# Patient Record
Sex: Male | Born: 1940 | Hispanic: Yes | Marital: Married | Smoking: Never smoker
Health system: Southern US, Community
[De-identification: ages and names within clinical notes are randomized; demographics above are authoritative.]

## PROBLEM LIST (undated history)

## (undated) HISTORY — PX: PROSTATECTOMY: SHX69

---

## 1995-05-13 DIAGNOSIS — W3400XA Accidental discharge from unspecified firearms or gun, initial encounter: Secondary | ICD-10-CM

## 1995-05-13 HISTORY — DX: Accidental discharge from unspecified firearms or gun, initial encounter: W34.00XA

## 2020-12-14 ENCOUNTER — Inpatient Hospital Stay (HOSPITAL_COMMUNITY)
Admission: EM | Admit: 2020-12-14 | Discharge: 2020-12-18 | DRG: 025 | Disposition: A | Discharge status: Home / Self Care | Payer: Self-pay | Attending: Neurological Surgery | Admitting: Neurological Surgery

## 2020-12-14 ENCOUNTER — Emergency Department (HOSPITAL_COMMUNITY): Payer: Self-pay

## 2020-12-14 ENCOUNTER — Other Ambulatory Visit: Payer: Self-pay

## 2020-12-14 ENCOUNTER — Encounter (HOSPITAL_COMMUNITY): Payer: Self-pay | Admitting: *Deleted

## 2020-12-14 DIAGNOSIS — E860 Dehydration: Secondary | ICD-10-CM | POA: Diagnosis present

## 2020-12-14 DIAGNOSIS — G935 Compression of brain: Secondary | ICD-10-CM | POA: Diagnosis present

## 2020-12-14 DIAGNOSIS — Z20822 Contact with and (suspected) exposure to covid-19: Secondary | ICD-10-CM | POA: Diagnosis present

## 2020-12-14 DIAGNOSIS — S065XAA Traumatic subdural hemorrhage with loss of consciousness status unknown, initial encounter: Secondary | ICD-10-CM | POA: Diagnosis present

## 2020-12-14 DIAGNOSIS — R519 Headache, unspecified: Secondary | ICD-10-CM

## 2020-12-14 DIAGNOSIS — S065X9A Traumatic subdural hemorrhage with loss of consciousness of unspecified duration, initial encounter: Secondary | ICD-10-CM | POA: Diagnosis present

## 2020-12-14 DIAGNOSIS — R270 Ataxia, unspecified: Secondary | ICD-10-CM | POA: Diagnosis present

## 2020-12-14 DIAGNOSIS — I6200 Nontraumatic subdural hemorrhage, unspecified: Principal | ICD-10-CM | POA: Diagnosis present

## 2020-12-14 LAB — RESP PANEL BY RT-PCR (FLU A&B, COVID) ARPGX2
Influenza A by PCR: NEGATIVE
Influenza B by PCR: NEGATIVE
SARS Coronavirus 2 by RT PCR: NEGATIVE

## 2020-12-14 LAB — BASIC METABOLIC PANEL
Anion gap: 7 (ref 5–15)
BUN: 17 mg/dL (ref 8–23)
CO2: 22 mmol/L (ref 22–32)
Calcium: 8.5 mg/dL — ABNORMAL LOW (ref 8.9–10.3)
Chloride: 105 mmol/L (ref 98–111)
Creatinine, Ser: 1.13 mg/dL (ref 0.61–1.24)
GFR, Estimated: >60 mL/min (ref >60)
Glucose, Bld: 152 mg/dL — ABNORMAL HIGH (ref 70–99)
Potassium: 3.5 mmol/L (ref 3.5–5.1)
Sodium: 134 mmol/L — ABNORMAL LOW (ref 135–145)

## 2020-12-14 LAB — CBC WITH DIFFERENTIAL/PLATELET
Abs Immature Granulocytes: 0.03 10*3/uL (ref 0.00–0.07)
Basophils Absolute: 0 10*3/uL (ref 0.0–0.1)
Basophils Relative: 0 %
Eosinophils Absolute: 0 10*3/uL (ref 0.0–0.5)
Eosinophils Relative: 0 %
HCT: 39.5 % (ref 39.0–52.0)
Hemoglobin: 13.6 g/dL (ref 13.0–17.0)
Immature Granulocytes: 0 %
Lymphocytes Relative: 8 %
Lymphs Abs: 0.6 10*3/uL — ABNORMAL LOW (ref 0.7–4.0)
MCH: 31.3 pg (ref 26.0–34.0)
MCHC: 34.4 g/dL (ref 30.0–36.0)
MCV: 91 fL (ref 80.0–100.0)
Monocytes Absolute: 0.1 10*3/uL (ref 0.1–1.0)
Monocytes Relative: 2 %
Neutro Abs: 6.8 10*3/uL (ref 1.7–7.7)
Neutrophils Relative %: 90 %
Platelets: 293 10*3/uL (ref 150–400)
RBC: 4.34 MIL/uL (ref 4.22–5.81)
RDW: 14.1 % (ref 11.5–15.5)
WBC: 7.6 10*3/uL (ref 4.0–10.5)
nRBC: 0 % (ref 0.0–0.2)

## 2020-12-14 MED ORDER — ONDANSETRON HCL 4 MG/2ML IJ SOLN
4.0000 mg | Freq: Once | INTRAMUSCULAR | Status: AC
  Administered 2020-12-14: 4 mg via INTRAVENOUS
  Filled 2020-12-14: qty 2

## 2020-12-14 MED ORDER — PROCHLORPERAZINE EDISYLATE 10 MG/2ML IJ SOLN
10.0000 mg | Freq: Once | INTRAMUSCULAR | Status: AC
  Administered 2020-12-14: 10 mg via INTRAVENOUS
  Filled 2020-12-14: qty 2

## 2020-12-14 MED ORDER — DIPHENHYDRAMINE HCL 50 MG/ML IJ SOLN
12.5000 mg | Freq: Once | INTRAMUSCULAR | Status: AC
  Administered 2020-12-14: 12.5 mg via INTRAVENOUS
  Filled 2020-12-14: qty 1

## 2020-12-14 MED ORDER — DEXAMETHASONE SODIUM PHOSPHATE 10 MG/ML IJ SOLN
10.0000 mg | Freq: Once | INTRAMUSCULAR | Status: AC
  Administered 2020-12-14: 10 mg via INTRAVENOUS
  Filled 2020-12-14: qty 1

## 2020-12-14 MED ORDER — SODIUM CHLORIDE 0.9 % IV BOLUS
500.0000 mL | Freq: Once | INTRAVENOUS | Status: AC
  Administered 2020-12-14: 500 mL via INTRAVENOUS

## 2020-12-14 NOTE — ED Triage Notes (Signed)
C/o headache  x 2 weeks, also is vomiting onset today

## 2020-12-14 NOTE — H&P (Signed)
Neurosurgery H&P  CC: Headache, leg weakness  HPI: This is a 80 y.o. man that presents with progressively worsening headaches with nausea and vomiting as well as leg weakness and some difficulty ambulating. The patient is obtunded and unable to provide history, his son is at bedside and was able to provide further history for me. Symptoms have been present for 2 weeks and progressive, he denies any head trauma or falls, he reports the patient has been working up until a few days ago due to the severe headaches. He does not know of any anti-platelet or anti-coagulant medication use by the patient. Further history limited due to the patient's depressed mental status   ROS: A 14 point ROS was performed and is negative except as noted in the HPI but limited due to the patient's depressed mental status  PMHx: History reviewed. No pertinent past medical history. FamHx: No family history on file. SocHx:  reports that he has never smoked. He has never used smokeless tobacco. He reports previous alcohol use. He reports that he does not use drugs.  Exam: Vital signs in last 24 hours: Temp:  [97.7 F (36.5 C)-98.6 F (37 C)] 98.6 F (37 C) (08/05 2251) Pulse Rate:  [27-100] 96 (08/05 2251) Resp:  [13-22] 22 (08/05 2251) BP: (120-151)/(62-81) 142/67 (08/05 2251) SpO2:  [91 %-100 %] 96 % (08/05 2251) Weight:  [64 kg] 64 kg (08/05 1544) General: obtunded, sitting in bed, otherwise appears fairly healthy for stated age Head: Normocephalic and atruamatic HEENT: Neck supple Pulmonary: breathing RA with good sat Cardiac: RRR Abdomen: S NT ND Extremities: Warm and well perfused x4 Neuro: obtunded, occasionally opens eyes partially and repositions in bed but does not open eyes to stim, PERRL, conjugate gaze, w/d x4 without preference   Assessment and Plan: 80 y.o. man with headaches / N / V and ataxia. 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 Got hydromorphone this morning, was reportedly awake and interactive prior to that, now obtunded but protecting his airway well  -OR this morning for bilateral burr holes -4N ICU post-op  Jadene Pierini, MD 12/14/20 11:08 PM [facility]

## 2020-12-14 NOTE — ED Provider Notes (Addendum)
Patient was received at shift change from [facility] provided HPI, current work-up, likely disposition.  HPI was collected while using Spanish interpreter, patient with no significant medical history presents to the emergency department with chief complaints of occipital headaches, started proximately 2 weeks ago, came on suddenly, states he has progressing gotten worse, denies any change in vision, or paresthesias, he does endorse some slight leg weakness bilaterally, denies any recent falls, not on anticoagulant no history of migraines.  States has been taking Tylenol any significant relief.  Current work-up reveals CBC unremarkable, BMP shows slight hyponatremia of 134, hyperglycemia 152, calcium 8.5, respiratory panel negative,CT head reveals bilateral mixed density subdural hematomas with slight mass-effect, with slight downward herniation.  Per previous provider no focal deficits present.  Plan-consult with neurosurgery for further recommendations.   on my exam there is no facial asymmetry, no difficulty word finding, no unilateral weakness present, vital signs are unremarkable  Spoke with Dr. Johnsie Cancel he does not recommend any further management at this time, as long as patient remains alert and oriented no need for mannitol.  He recommends direct admit to [facility] under neurosurgery care, he will take the patient to the OR for drainage.  Patient will be transferred to [facility] for further evaluation.  Transport has been delayed, concern for decompensation with inadequate support will, will send patient ED to ED. Dr. Rosalia Hammers at [facility] emergency department has accepted the patient in transfer and will be admitted by Dr. Johnsie Cancel of neurosurgery.    Patient is reevaluated no facial asymmetry, no slurring of his words, no difficult word finding, no unilateral weakness present, currently mentating, vital signs have remained stable.     Carroll Sage, PA-C 12/14/20  2029    Carroll Sage, PA-C 12/14/20 2124    Pricilla Loveless, MD 12/14/20 [phone]

## 2020-12-14 NOTE — ED Provider Notes (Signed)
[facility] EMERGENCY DEPARTMENT Provider Note   CSN: [id number] Arrival date & time: 12/14/20  1533     History Chief Complaint  Patient presents with   Headache    Ricky Valencia is a 80 y.o. male with no significant past medical history presenting for evaluation of a constant and worsening occipital headache which started approximately 2 weeks ago.  He has had no traumas to relate regarding this headache.  He denies neck pain or stiffness and has had no fevers or chills.  He denies prior history of headache problems.  His headache has progressively worsened, it initially was responding to Tylenol but Tylenol does not currently make any difference.  He does have some mild photophobia and has had development of nausea with a couple of episodes of vomiting today.  Person at the bedside he was seen at [facility] department today and blood tests were drawn and he was found to be dehydrated and was sent here for treatment of that condition.  He denies reason for dehydration has not been outdoors in the heat and has been drinking plenty of fluids, particularly Gatorade.  He has had no recent illnesses, denies sinus or ear pain, no sore throat, nasal drainage.  He also denies chest pain, shortness of breath, abdominal pain.  He has had no focal weakness.  He does however endorse some mild lightheadedness.  His headache is worsened when he lies down.  The history is provided by the patient and a relative. The history is limited by a language barrier. A language interpreter was used.      History reviewed. No pertinent past medical history.  There are no problems to display for this patient.   History reviewed. No pertinent surgical history.     No family history on file.  Social History   Tobacco Use   Smoking status: Never   Smokeless tobacco: Never  Substance Use Topics   Alcohol use: Not Currently   Drug use: Never    Home Medications Prior to Admission  medications   Not on File    Allergies    Patient has no known allergies.  Review of Systems   Review of Systems  Constitutional:  Negative for chills and fever.  HENT:  Negative for congestion, sinus pain and sore throat.   Eyes:  Positive for photophobia. Negative for pain and visual disturbance.  Respiratory:  Negative for chest tightness and shortness of breath.   Cardiovascular:  Negative for chest pain.  Gastrointestinal:  Positive for nausea and vomiting. Negative for abdominal pain.  Genitourinary: Negative.   Musculoskeletal:  Negative for arthralgias, joint swelling and neck pain.  Skin: Negative.  Negative for rash and wound.  Neurological:  Positive for light-headedness and headaches. Negative for dizziness, weakness and numbness.  Psychiatric/Behavioral: Negative.    All other systems reviewed and are negative.  Physical Exam Updated Vital Signs BP 133/81   Pulse 75   Temp 97.7 F (36.5 C) (Oral)   Resp 16   Ht 5\' 1"  (1.549 m)   Wt 64 kg   SpO2 99%   BMI 26.64 kg/m   Physical Exam Vitals and nursing note reviewed.  Constitutional:      Appearance: He is well-developed.     Comments: Sitting with eyes closed.  Uncomfortable appearing  HENT:     Head: Normocephalic and atraumatic.     Comments: No scalp hematoma, no rash.     Right Ear: Tympanic membrane normal.  Left Ear: Tympanic membrane normal.  Eyes:     Extraocular Movements: Extraocular movements intact.     Right eye: Normal extraocular motion.     Left eye: Normal extraocular motion.     Pupils: Pupils are equal, round, and reactive to light.  Cardiovascular:     Rate and Rhythm: Normal rate.     Heart sounds: Normal heart sounds.  Pulmonary:     Effort: Pulmonary effort is normal.  Abdominal:     Palpations: Abdomen is soft.     Tenderness: There is no abdominal tenderness.  Musculoskeletal:        General: Normal range of motion.     Cervical back: Normal range of motion and neck  supple. No rigidity.  Lymphadenopathy:     Cervical: No cervical adenopathy.  Skin:    General: Skin is warm and dry.     Findings: No rash.  Neurological:     General: No focal deficit present.     Mental Status: He is alert and oriented to person, place, and time.     GCS: GCS eye subscore is 4. GCS verbal subscore is 5. GCS motor subscore is 6.     Cranial Nerves: No cranial nerve deficit or facial asymmetry.     Sensory: No sensory deficit.     Coordination: Coordination normal.     Gait: Gait normal.     Deep Tendon Reflexes: Reflexes normal.     Comments: Normal heel-shin, rapid alternating movements are slow and deliberate but coordinated. Cranial nerves III-XII intact.  No pronator drift.  Psychiatric:        Speech: Speech normal.        Behavior: Behavior normal.        Thought Content: Thought content normal.    ED Results / Procedures / Treatments   Labs (all labs ordered are listed, but only abnormal results are displayed) Labs Reviewed  CBC WITH DIFFERENTIAL/PLATELET  BASIC METABOLIC PANEL    EKG None  Radiology No results found.  Procedures Procedures   Medications Ordered in ED Medications  ondansetron (ZOFRAN) injection 4 mg (4 mg Intravenous Given 12/14/20 1743)  sodium chloride 0.9 % bolus 500 mL (500 mLs Intravenous New Bag/Given 12/14/20 1847)  dexamethasone (DECADRON) injection 10 mg (10 mg Intravenous Given 12/14/20 1847)  prochlorperazine (COMPAZINE) injection 10 mg (10 mg Intravenous Given 12/14/20 1849)  diphenhydrAMINE (BENADRYL) injection 12.5 mg (12.5 mg Intravenous Given 12/14/20 1848)    ED Course  I have reviewed the triage vital signs and the nursing notes.  Pertinent labs & imaging results that were available during my care of the patient were reviewed by me and considered in my medical decision making (see chart for details).    MDM Rules/Calculators/A&P                           Patient with a 2-week history of escalating occipital  headache, mild photophobia, now with nausea and vomiting.  He has no nuchal rigidity, no fevers, doubt meningitis.  CT imaging and basic labs have been ordered.  He was also given a 500 cc bolus of normal saline given his diagnosis of dehydration this morning at the health department.  Migraine cocktail was also ordered.  Pending management pending results of today's test.  Discussed with Will Uvaldo Rising, PA-C who assumes care of this patient. Final Clinical Impression(s) / ED Diagnoses Final diagnoses:  None    Rx / DC  Orders ED Discharge Orders     None        Lendon Lain 12/14/20 Aretha Parrot, MD 12/14/20 [phone]

## 2020-12-14 NOTE — ED Provider Notes (Signed)
10:50 PM Patient care assumed in transfer.  He is pending admission to the neurosurgical service.  Consult placed to Dr. Maurice Small to alert him to the patient's arrival in the [facility], ED.  Patient is easily awake and alert.  He follows commands.  He has no obvious focal deficits, moving all extremities spontaneously.  VSS.  11:05 PM Dr. Maurice Small aware of patient's arrival. He will place admission orders.   Antony Madura, PA-C 12/14/20 2306    Margarita Grizzle, MD 12/18/20 0000

## 2020-12-15 ENCOUNTER — Encounter (HOSPITAL_COMMUNITY): Payer: Self-pay | Admitting: Neurological Surgery

## 2020-12-15 ENCOUNTER — Encounter (HOSPITAL_COMMUNITY)
Admission: EM | Disposition: A | Discharge status: Home / Self Care | Payer: Self-pay | Source: Home / Self Care | Attending: Neurological Surgery

## 2020-12-15 ENCOUNTER — Inpatient Hospital Stay (HOSPITAL_COMMUNITY): Payer: Self-pay | Admitting: Certified Registered"

## 2020-12-15 HISTORY — PX: CRANIOTOMY: SHX93

## 2020-12-15 LAB — PROTIME-INR
INR: 1 (ref 0.8–1.2)
Prothrombin Time: 13.3 seconds (ref 11.4–15.2)

## 2020-12-15 LAB — CBC
HCT: 40.2 % (ref 39.0–52.0)
Hemoglobin: 14 g/dL (ref 13.0–17.0)
MCH: 31.7 pg (ref 26.0–34.0)
MCHC: 34.8 g/dL (ref 30.0–36.0)
MCV: 91 fL (ref 80.0–100.0)
Platelets: 258 10*3/uL (ref 150–400)
RBC: 4.42 MIL/uL (ref 4.22–5.81)
RDW: 13.9 % (ref 11.5–15.5)
WBC: 7.7 10*3/uL (ref 4.0–10.5)
nRBC: 0 % (ref 0.0–0.2)

## 2020-12-15 LAB — CREATININE, SERUM
Creatinine, Ser: 1.09 mg/dL (ref 0.61–1.24)
GFR, Estimated: >60 mL/min (ref >60)

## 2020-12-15 LAB — MRSA NEXT GEN BY PCR, NASAL: MRSA by PCR Next Gen: NOT DETECTED

## 2020-12-15 LAB — APTT: aPTT: 26 seconds (ref 24–36)

## 2020-12-15 SURGERY — CRANIOTOMY HEMATOMA EVACUATION SUBDURAL
Anesthesia: General | Site: Head | Laterality: Bilateral

## 2020-12-15 MED ORDER — ORAL CARE MOUTH RINSE: 15.0000 mL | Freq: Once | OROMUCOSAL | Status: DC

## 2020-12-15 MED ORDER — SUCCINYLCHOLINE CHLORIDE 200 MG/10ML IV SOSY
PREFILLED_SYRINGE | INTRAVENOUS | Status: DC | PRN
  Administered 2020-12-15: 80 mg via INTRAVENOUS

## 2020-12-15 MED ORDER — HYDROCODONE-ACETAMINOPHEN 5-325 MG PO TABS
1.0000 | ORAL_TABLET | ORAL | Status: DC | PRN
Start: 2020-12-15 — End: 2020-12-18

## 2020-12-15 MED ORDER — ONDANSETRON HCL 4 MG PO TABS: 4.0000 mg | ORAL_TABLET | ORAL | Status: DC | PRN

## 2020-12-15 MED ORDER — PROPOFOL 10 MG/ML IV BOLUS
INTRAVENOUS | Status: DC | PRN
  Administered 2020-12-15: 100 mg via INTRAVENOUS

## 2020-12-15 MED ORDER — THROMBIN 20000 UNITS EX SOLR
CUTANEOUS | Status: DC | PRN
  Administered 2020-12-15: 20 mL via TOPICAL

## 2020-12-15 MED ORDER — ONDANSETRON HCL 4 MG/2ML IJ SOLN
INTRAMUSCULAR | Status: AC
  Filled 2020-12-15: qty 2

## 2020-12-15 MED ORDER — HYDROMORPHONE HCL 1 MG/ML IJ SOLN
0.5000 mg | INTRAMUSCULAR | Status: DC | PRN
Start: 2020-12-15 — End: 2020-12-18
  Administered 2020-12-15 (×2): 0.5 mg via INTRAVENOUS
  Filled 2020-12-15 (×2): qty 1

## 2020-12-15 MED ORDER — CHLORHEXIDINE GLUCONATE 0.12 % MT SOLN: 15.0000 mL | Freq: Once | OROMUCOSAL | Status: DC

## 2020-12-15 MED ORDER — SODIUM CHLORIDE 0.9 % IV SOLN: INTRAVENOUS | Status: DC

## 2020-12-15 MED ORDER — BACITRACIN ZINC 500 UNIT/GM EX OINT
TOPICAL_OINTMENT | CUTANEOUS | Status: AC
  Filled 2020-12-15: qty 28.35

## 2020-12-15 MED ORDER — THROMBIN 5000 UNITS EX SOLR
OROMUCOSAL | Status: DC | PRN
  Administered 2020-12-15: 5 mL via TOPICAL

## 2020-12-15 MED ORDER — ACETAMINOPHEN 650 MG RE SUPP: 650.0000 mg | RECTAL | Status: DC | PRN

## 2020-12-15 MED ORDER — ACETAMINOPHEN 325 MG PO TABS
650.0000 mg | ORAL_TABLET | ORAL | Status: DC | PRN
  Administered 2020-12-18: 650 mg via ORAL
  Filled 2020-12-15 (×2): qty 2

## 2020-12-15 MED ORDER — THROMBIN 5000 UNITS EX SOLR
CUTANEOUS | Status: AC
  Filled 2020-12-15: qty 5000

## 2020-12-15 MED ORDER — PHENYLEPHRINE HCL-NACL 20-0.9 MG/250ML-% IV SOLN
INTRAVENOUS | Status: DC | PRN
Start: 2020-12-15 — End: 2020-12-15
  Administered 2020-12-15: 40 ug/min via INTRAVENOUS

## 2020-12-15 MED ORDER — PROMETHAZINE HCL 25 MG PO TABS: 12.5000 mg | ORAL_TABLET | ORAL | Status: DC | PRN

## 2020-12-15 MED ORDER — PHENYLEPHRINE 40 MCG/ML (10ML) SYRINGE FOR IV PUSH (FOR BLOOD PRESSURE SUPPORT)
PREFILLED_SYRINGE | INTRAVENOUS | Status: DC | PRN
  Administered 2020-12-15 (×3): 80 ug via INTRAVENOUS
  Administered 2020-12-15: 160 ug via INTRAVENOUS

## 2020-12-15 MED ORDER — DEXAMETHASONE SODIUM PHOSPHATE 10 MG/ML IJ SOLN
INTRAMUSCULAR | Status: DC | PRN
  Administered 2020-12-15: 8 mg via INTRAVENOUS

## 2020-12-15 MED ORDER — DOCUSATE SODIUM 100 MG PO CAPS
100.0000 mg | ORAL_CAPSULE | Freq: Two times a day (BID) | ORAL | Status: DC
  Administered 2020-12-15 – 2020-12-18 (×4): 100 mg via ORAL
  Filled 2020-12-15 (×2): qty 1

## 2020-12-15 MED ORDER — HEPARIN SODIUM (PORCINE) 5000 UNIT/ML IJ SOLN
5000.0000 [IU] | Freq: Three times a day (TID) | INTRAMUSCULAR | Status: DC
  Administered 2020-12-17 – 2020-12-18 (×4): 5000 [IU] via SUBCUTANEOUS
  Filled 2020-12-15 (×4): qty 1

## 2020-12-15 MED ORDER — LIDOCAINE HCL (CARDIAC) PF 100 MG/5ML IV SOSY
PREFILLED_SYRINGE | INTRAVENOUS | Status: DC | PRN
  Administered 2020-12-15: 40 mg via INTRATRACHEAL

## 2020-12-15 MED ORDER — ONDANSETRON HCL 4 MG/2ML IJ SOLN
4.0000 mg | INTRAMUSCULAR | Status: DC | PRN
  Administered 2020-12-15: 4 mg via INTRAVENOUS
  Filled 2020-12-15: qty 2

## 2020-12-15 MED ORDER — BACITRACIN ZINC 500 UNIT/GM EX OINT
TOPICAL_OINTMENT | CUTANEOUS | Status: DC | PRN
  Administered 2020-12-15: 1 via TOPICAL

## 2020-12-15 MED ORDER — ROCURONIUM BROMIDE 10 MG/ML (PF) SYRINGE
PREFILLED_SYRINGE | INTRAVENOUS | Status: AC
  Filled 2020-12-15: qty 10

## 2020-12-15 MED ORDER — 0.9 % SODIUM CHLORIDE (POUR BTL) OPTIME
TOPICAL | Status: DC | PRN
  Administered 2020-12-15 (×2): 1000 mL

## 2020-12-15 MED ORDER — DEXAMETHASONE SODIUM PHOSPHATE 10 MG/ML IJ SOLN
INTRAMUSCULAR | Status: AC
  Filled 2020-12-15: qty 1

## 2020-12-15 MED ORDER — FENTANYL CITRATE (PF) 250 MCG/5ML IJ SOLN
INTRAMUSCULAR | Status: DC | PRN
  Administered 2020-12-15: 50 ug via INTRAVENOUS
  Administered 2020-12-15: 25 ug via INTRAVENOUS

## 2020-12-15 MED ORDER — FENTANYL CITRATE (PF) 250 MCG/5ML IJ SOLN
INTRAMUSCULAR | Status: AC
  Filled 2020-12-15: qty 5

## 2020-12-15 MED ORDER — LIDOCAINE-EPINEPHRINE 1 %-1:100000 IJ SOLN
INTRAMUSCULAR | Status: DC | PRN
  Administered 2020-12-15: 10 mL via INTRADERMAL

## 2020-12-15 MED ORDER — LABETALOL HCL 5 MG/ML IV SOLN: 10.0000 mg | INTRAVENOUS | Status: DC | PRN

## 2020-12-15 MED ORDER — ROCURONIUM BROMIDE 10 MG/ML (PF) SYRINGE
PREFILLED_SYRINGE | INTRAVENOUS | Status: DC | PRN
  Administered 2020-12-15: 30 mg via INTRAVENOUS

## 2020-12-15 MED ORDER — CEFAZOLIN SODIUM-DEXTROSE 2-3 GM-%(50ML) IV SOLR
INTRAVENOUS | Status: DC | PRN
  Administered 2020-12-15: 2 g via INTRAVENOUS

## 2020-12-15 MED ORDER — SUGAMMADEX SODIUM 200 MG/2ML IV SOLN
INTRAVENOUS | Status: DC | PRN
  Administered 2020-12-15: 200 mg via INTRAVENOUS

## 2020-12-15 MED ORDER — THROMBIN 20000 UNITS EX SOLR
CUTANEOUS | Status: AC
  Filled 2020-12-15: qty 20000

## 2020-12-15 MED ORDER — LIDOCAINE-EPINEPHRINE 1 %-1:100000 IJ SOLN
INTRAMUSCULAR | Status: AC
  Filled 2020-12-15: qty 1

## 2020-12-15 MED ORDER — CHLORHEXIDINE GLUCONATE CLOTH 2 % EX PADS
6.0000 | MEDICATED_PAD | Freq: Every day | CUTANEOUS | Status: DC
  Administered 2020-12-15 – 2020-12-18 (×4): 6 via TOPICAL

## 2020-12-15 MED ORDER — POLYETHYLENE GLYCOL 3350 17 G PO PACK: 17.0000 g | PACK | Freq: Every day | ORAL | Status: DC | PRN

## 2020-12-15 MED ORDER — ONDANSETRON HCL 4 MG/2ML IJ SOLN
INTRAMUSCULAR | Status: DC | PRN
  Administered 2020-12-15: 4 mg via INTRAVENOUS

## 2020-12-15 SURGICAL SUPPLY — 60 items
BAG COUNTER SPONGE SURGICOUNT (BAG) ×2 IMPLANT
BLADE CLIPPER SURG (BLADE) ×2 IMPLANT
BNDG GAUZE ELAST 4 BULKY (GAUZE/BANDAGES/DRESSINGS) IMPLANT
BUR ACORN 9.0 PRECISION (BURR) ×2 IMPLANT
BUR MATCHSTICK NEURO 3.0 LAGG (BURR) IMPLANT
BUR SPIRAL ROUTER 2.3 (BUR) ×2 IMPLANT
CANISTER SUCT 3000ML PPV (MISCELLANEOUS) ×2 IMPLANT
CLIP VESOCCLUDE MED 6/CT (CLIP) IMPLANT
DRAPE NEUROLOGICAL W/INCISE (DRAPES) ×2 IMPLANT
DRAPE SHEET LG 3/4 BI-LAMINATE (DRAPES) ×2 IMPLANT
DRAPE SURG 17X23 STRL (DRAPES) IMPLANT
DRAPE WARM FLUID 44X44 (DRAPES) ×2 IMPLANT
DURAPREP 6ML APPLICATOR 50/CS (WOUND CARE) ×2 IMPLANT
ELECT REM PT RETURN 9FT ADLT (ELECTROSURGICAL) ×2
ELECTRODE REM PT RTRN 9FT ADLT (ELECTROSURGICAL) ×1 IMPLANT
EVACUATOR 1/8 PVC DRAIN (DRAIN) IMPLANT
EVACUATOR SILICONE 100CC (DRAIN) IMPLANT
GAUZE 4X4 16PLY ~~LOC~~+RFID DBL (SPONGE) ×4 IMPLANT
GAUZE SPONGE 4X4 12PLY STRL (GAUZE/BANDAGES/DRESSINGS) ×2 IMPLANT
GLOVE EXAM NITRILE LRG STRL (GLOVE) IMPLANT
GLOVE EXAM NITRILE XL STR (GLOVE) IMPLANT
GLOVE EXAM NITRILE XS STR PU (GLOVE) IMPLANT
GLOVE SURG LTX SZ7 (GLOVE) ×2 IMPLANT
GLOVE SURG LTX SZ7.5 (GLOVE) ×4 IMPLANT
GLOVE SURG UNDER POLY LF SZ7.5 (GLOVE) ×4 IMPLANT
GOWN STRL REUS W/ TWL LRG LVL3 (GOWN DISPOSABLE) ×2 IMPLANT
GOWN STRL REUS W/ TWL XL LVL3 (GOWN DISPOSABLE) IMPLANT
GOWN STRL REUS W/TWL 2XL LVL3 (GOWN DISPOSABLE) IMPLANT
GOWN STRL REUS W/TWL LRG LVL3 (GOWN DISPOSABLE) ×2
GOWN STRL REUS W/TWL XL LVL3 (GOWN DISPOSABLE)
HEMOSTAT POWDER KIT SURGIFOAM (HEMOSTASIS) ×2 IMPLANT
HEMOSTAT SURGICEL 2X14 (HEMOSTASIS) IMPLANT
KIT BASIN OR (CUSTOM PROCEDURE TRAY) ×2 IMPLANT
KIT TURNOVER KIT B (KITS) ×2 IMPLANT
NEEDLE HYPO 22GX1.5 SAFETY (NEEDLE) ×2 IMPLANT
NS IRRIG 1000ML POUR BTL (IV SOLUTION) ×2 IMPLANT
PACK CRANIOTOMY CUSTOM (CUSTOM PROCEDURE TRAY) ×2 IMPLANT
PATTIES SURGICAL .5 X.5 (GAUZE/BANDAGES/DRESSINGS) IMPLANT
PATTIES SURGICAL .5 X3 (DISPOSABLE) IMPLANT
PATTIES SURGICAL 1X1 (DISPOSABLE) IMPLANT
SPONGE NEURO XRAY DETECT 1X3 (DISPOSABLE) IMPLANT
SPONGE SURGIFOAM ABS GEL 100 (HEMOSTASIS) ×2 IMPLANT
STAPLER VISISTAT 35W (STAPLE) ×2 IMPLANT
STOCKINETTE 6  STRL (DRAPES) ×1
STOCKINETTE 6 STRL (DRAPES) ×1 IMPLANT
SUT ETHILON 3 0 FSL (SUTURE) IMPLANT
SUT ETHILON 3 0 PS 1 (SUTURE) IMPLANT
SUT MNCRL AB 3-0 PS2 27 (SUTURE) ×2 IMPLANT
SUT MON AB 3-0 SH 27 (SUTURE)
SUT MON AB 3-0 SH27 (SUTURE) IMPLANT
SUT NURALON 4 0 TR CR/8 (SUTURE) ×6 IMPLANT
SUT VIC AB 0 CT1 18XCR BRD8 (SUTURE) ×2 IMPLANT
SUT VIC AB 0 CT1 8-18 (SUTURE) ×2
SUT VIC AB 3-0 SH 8-18 (SUTURE) ×4 IMPLANT
TOWEL GREEN STERILE (TOWEL DISPOSABLE) ×2 IMPLANT
TOWEL GREEN STERILE FF (TOWEL DISPOSABLE) ×2 IMPLANT
TRAY FOLEY MTR SLVR 16FR STAT (SET/KITS/TRAYS/PACK) ×2 IMPLANT
TUBE CONNECTING 12X1/4 (SUCTIONS) ×2 IMPLANT
UNDERPAD 30X36 HEAVY ABSORB (UNDERPADS AND DIAPERS) ×2 IMPLANT
WATER STERILE IRR 1000ML POUR (IV SOLUTION) ×2 IMPLANT

## 2020-12-15 NOTE — ED Notes (Signed)
Med given for headache 

## 2020-12-15 NOTE — Transfer of Care (Signed)
Immediate Anesthesia Transfer of Care Note  Patient: Ricky Valencia  Procedure(s) Performed: BILATERAL BURR HOLES FOR SUBDURAL HEMATOMA EVACUATION (Bilateral: Head)  Patient Location: PACU  Anesthesia Type:General  Level of Consciousness: sedated  Airway & Oxygen Therapy: Patient Spontanous Breathing and Patient connected to nasal cannula oxygen  Post-op Assessment: Report given to RN and Post -op Vital signs reviewed and stable  Post vital signs: Reviewed and stable  Last Vitals:  Vitals Value Taken Time  BP 110/67 12/15/20 1300  Temp    Pulse 85 12/15/20 1300  Resp 17 12/15/20 1300  SpO2 100 % 12/15/20 1300    Last Pain:  Vitals:   12/15/20 1123  TempSrc: Oral  PainSc:          Complications: No notable events documented.

## 2020-12-15 NOTE — ED Notes (Signed)
The  Pt is resting now  Apparently the pain med kicked in

## 2020-12-15 NOTE — ED Notes (Signed)
Family at  The bedside reports  The pt has had a headache for 2 weeks  Worse today  The pt has pulled off his gown pulls at wires keeping his eyes closed moves all 4 etremities he speaks no english

## 2020-12-15 NOTE — Progress Notes (Signed)
Neurosurgery Service Progress Note  Subjective: No acute events overnight   Objective: Vitals:   12/15/20 1000 12/15/20 1030 12/15/20 1050 12/15/20 1123  BP: 125/75 (!) 142/65 135/78 (!) 148/75  Pulse: 71 79 75 94  Resp: 14 15 15 18   Temp:   98.1 F (36.7 C) 98.3 F (36.8 C)  TempSrc:   Oral Oral  SpO2: 99% 99% 99% 98%  Weight:      Height:        Physical Exam: Somnolent, raises eyebrows to voice but doesn't open eyes, w/d x4  Assessment & Plan: 80 y.o. man w/ b/l mixed SDH and obtundation.  -OR today for b/l burr hole evacuation  72  12/15/20 11:53 AM

## 2020-12-15 NOTE — Anesthesia Procedure Notes (Signed)
Procedure Name: Intubation Date/Time: 12/15/2020 11:55 AM Performed by: Leonor Liv, CRNA Pre-anesthesia Checklist: Patient identified, Emergency Drugs available, Suction available and Patient being monitored Patient Re-evaluated:Patient Re-evaluated prior to induction Oxygen Delivery Method: Circle System Utilized Preoxygenation: Pre-oxygenation with 100% oxygen Induction Type: IV induction Ventilation: Mask ventilation without difficulty Laryngoscope Size: Mac and 4 Grade View: Grade I Tube type: Oral Tube size: 7.5 mm Number of attempts: 1 Airway Equipment and Method: Stylet and Oral airway Placement Confirmation: ETT inserted through vocal cords under direct vision, positive ETCO2 and breath sounds checked- equal and bilateral Secured at: 22 cm Tube secured with: Tape Dental Injury: Teeth and Oropharynx as per pre-operative assessment

## 2020-12-15 NOTE — Progress Notes (Signed)
OT Cancellation Note  Patient Details Name: Ricky Valencia MRN: [id number] DOB: 1941/03/06   Cancelled Treatment:    Reason Eval/Treat Not Completed: Patient not medically ready. Underwent Bur hole procedure this am.  Will check back.  Eber Jones., OTR/L Acute Rehabilitation Services Pager [phone] Office [phone]   Jeani Hawking M 12/15/2020, 1:50 PM

## 2020-12-15 NOTE — Anesthesia Preprocedure Evaluation (Addendum)
Anesthesia Evaluation  Patient identified by MRN, date of birth, ID band Patient confused    Reviewed: Allergy & Precautions, NPO status , Patient's Chart, lab work & pertinent test results  Airway Mallampati: II  TM Distance: >3 FB Neck ROM: Full    Dental no notable dental hx. (+) Dental Advisory Given   Pulmonary neg pulmonary ROS,    Pulmonary exam normal breath sounds clear to auscultation       Cardiovascular negative cardio ROS   Rhythm:Regular Rate:Normal     Neuro/Psych negative neurological ROS     GI/Hepatic negative GI ROS, Neg liver ROS,   Endo/Other  negative endocrine ROS  Renal/GU negative Renal ROS     Musculoskeletal negative musculoskeletal ROS (+)   Abdominal   Peds  Hematology negative hematology ROS (+)   Anesthesia Other Findings   Reproductive/Obstetrics                            Anesthesia Physical Anesthesia Plan  ASA: 2 and emergent  Anesthesia Plan: General   Post-op Pain Management:    Induction: Intravenous  PONV Risk Score and Plan: 2 and Ondansetron, Dexamethasone and Treatment may vary due to age or medical condition  Airway Management Planned: Oral ETT  Additional Equipment:   Intra-op Plan:   Post-operative Plan: Extubation in OR  Informed Consent: I have reviewed the patients History and Physical, chart, labs and discussed the procedure including the risks, benefits and alternatives for the proposed anesthesia with the patient or authorized representative who has indicated his/her understanding and acceptance.     Dental advisory given  Plan Discussed with: CRNA  Anesthesia Plan Comments:         Anesthesia Quick Evaluation

## 2020-12-15 NOTE — Op Note (Signed)
PATIENT: Ricky Valencia  DAY OF SURGERY: 12/15/20   PRE-OPERATIVE DIAGNOSIS:  Bilateral subdural hematomas   POST-OPERATIVE DIAGNOSIS:  Same   PROCEDURE:  Bilateral burr hole evacuation of subdural hematomas   SURGEON:  Surgeon(s) and Role:    Jadene Pierini, MD - Primary   ANESTHESIA: ETGA   BRIEF HISTORY: This is a 80 year old man who presented with progressive headaches with nausea and vomiting as well as some difficulty ambulating. The patient was found to have bilateral mixed density subdural hematomas with significant brain compression. I therefore recommended bilateral burr hole evacuation. This was discussed with the patient and his son as well as risks and benefits and they wished to proceed with surgery.   OPERATIVE DETAIL: The patient was taken to the operating room and placed on the OR table in the supine position. A formal time out was performed with two patient identifiers and confirmed the operative site. Anesthesia was induced by the anesthesia team. The operative sites were marked, hair was clipped with surgical clippers, the area was then prepped and draped in a sterile fashion.   A linear incision was placed in the right frontal region behind the hairline. Soft tissues were retracted and a small retractor was placed. A high speed drill and rongeurs were used to make a burr hole, the dura was opened and coagulated with return of mixed chronicity blood products under pressure. The subdural space was copiously irrigated until clear irrigation returned. A ventricular drain was placed into the subdural space, tunneled posteriorly, and secured with suture. The incision was copiously irrigated and closed in layers.  This same technique was then performed on the left side in the same fashion. On the left, there was similarly mixed chronicity blood products under high pressure in the subdural space. The same technique of irrigation, drain placement, irrigation, and multi-layered  closure was then completed. The drains were hooked up to gravity drainage, ll instrument and sponge counts were correct, and the patient was then returned to anesthesia for emergence. No apparent complications at the completion of the procedure.   EBL:  59mL   DRAINS: Bilateral ventricular catheters in the subdural space   SPECIMENS: none   Jadene Pierini, MD 12/15/20 10:40 AM

## 2020-12-15 NOTE — ED Notes (Signed)
MD at bedside. Consent for surgery obtained at this time with MD and this RN as witness.

## 2020-12-15 NOTE — ED Notes (Signed)
The pt has pulled off his pulse ox wire again  The family member has come and gone

## 2020-12-15 NOTE — ED Notes (Signed)
Pt confused still he has pulled all his leads off his pulse ox  Wire and his bp cuff

## 2020-12-15 NOTE — ED Notes (Signed)
The pt keeps pulling his pulse o off

## 2020-12-15 NOTE — ED Notes (Signed)
Report given to [facility]. Pt is going to OR at [facility].

## 2020-12-16 NOTE — Progress Notes (Signed)
Spoke to patient's son over the phone and gave updates at this time.
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
## 2020-12-16 NOTE — Progress Notes (Signed)
Neurosurgery Service Progress Note  Subjective: No acute events overnight, doing dramatically better today, feels like his normal self again, preop headaches resolved   Objective: Vitals:   12/16/20 0700 12/16/20 0800 12/16/20 0900 12/16/20 1000  BP: 130/65 129/76 133/72 130/82  Pulse: 79 78 83 97  Resp: 16 (!) 23 18 (!) 22  Temp:  97.9 F (36.6 C)    TempSrc:  Oral    SpO2: 100% 100% 100% 100%  Weight:      Height:        Physical Exam: AOx3, PERRL, EOMI, FS, TM, Strength 5/5 x4, SILTx4 Incisions c/d/I, drains w/ chronic blood products, L flushed on rounds with improvement in output  Assessment & Plan: 80 y.o. man s/p burr hole evacuation of b/l SDH, recovering well.  -cont drains, likely d/c tomorrow depending on output -SCDs/TEDs, hold SQH until tomorrow  Jadene Pierini  12/16/20 10:22 AM

## 2020-12-17 ENCOUNTER — Encounter (HOSPITAL_COMMUNITY): Payer: Self-pay | Admitting: Neurological Surgery

## 2020-12-17 LAB — BASIC METABOLIC PANEL
Anion gap: 8 (ref 5–15)
BUN: 17 mg/dL (ref 8–23)
CO2: 21 mmol/L — ABNORMAL LOW (ref 22–32)
Calcium: 8.4 mg/dL — ABNORMAL LOW (ref 8.9–10.3)
Chloride: 106 mmol/L (ref 98–111)
Creatinine, Ser: 1.12 mg/dL (ref 0.61–1.24)
GFR, Estimated: >60 mL/min (ref >60)
Glucose, Bld: 100 mg/dL — ABNORMAL HIGH (ref 70–99)
Potassium: 3.9 mmol/L (ref 3.5–5.1)
Sodium: 135 mmol/L (ref 135–145)

## 2020-12-17 MED ORDER — LIDOCAINE HCL (PF) 1 % IJ SOLN
INTRAMUSCULAR | Status: AC
  Filled 2020-12-17: qty 30

## 2020-12-17 MED ORDER — LIDOCAINE HCL (PF) 1 % IJ SOLN
INTRAMUSCULAR | Status: AC
  Filled 2020-12-17: qty 5

## 2020-12-17 NOTE — Progress Notes (Addendum)
Neurosurgery Service Progress Note  Subjective: No acute events overnight, no new complaints  Objective: Vitals:   12/17/20 1700 12/17/20 1800 12/17/20 1900 12/17/20 2000  BP: (!) 157/99 (!) 158/94 123/89 127/61  Pulse: 83 84 88 90  Resp: 18 19 19 15   Temp:    98.5 F (36.9 C)  TempSrc:    Oral  SpO2: 99% 97% 99% 98%  Weight:      Height:        Physical Exam: AOx3, PERRL, EOMI, FS, TM, Strength 5/5 x4, SILTx4 Incisions c/d/I, drains d/c'd on rounds  Assessment & Plan: 80 y.o. man s/p burr hole evacuation of b/l SDH, recovering well, drains d/c'd 8/8, PT rec'd outpt PT  -likely discharge home w/ home PT tomorrow if he continues to do well -transfer to regular nursing floor -SCDs/TEDs, 10/8  12/17/20 9:47 PM
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
## 2020-12-18 MED ORDER — HYDROCODONE-ACETAMINOPHEN 5-325 MG PO TABS: 1.0000 | ORAL_TABLET | ORAL | 0 refills | Status: AC | PRN
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
## 2020-12-18 NOTE — Anesthesia Postprocedure Evaluation (Signed)
Anesthesia Post Note  Patient: Ricky Valencia  Procedure(s) Performed: BILATERAL BURR HOLES FOR SUBDURAL HEMATOMA EVACUATION (Bilateral: Head)     Patient location during evaluation: PACU Anesthesia Type: General Level of consciousness: sedated and patient cooperative Pain management: pain level controlled Vital Signs Assessment: post-procedure vital signs reviewed and stable Respiratory status: spontaneous breathing Cardiovascular status: stable Anesthetic complications: no   No notable events documented.  Last Vitals:  Vitals:   12/18/20 0800 12/18/20 0900  BP: 116/80 118/74  Pulse: 76 83  Resp: 19 (!) 21  Temp: 36.7 C   SpO2: 95% 96%    Last Pain:  Vitals:   12/18/20 0800  TempSrc: Axillary  PainSc: 2                  Lewie Loron
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
## 2020-12-18 NOTE — Progress Notes (Addendum)
Neurosurgery Service Progress Note  Subjective: No acute events overnight, no new complaints  Objective: Vitals:   12/18/20 0600 12/18/20 0700 12/18/20 0800 12/18/20 0900  BP: (!) 99/53 90/66 116/80 118/74  Pulse:   76 83  Resp: 12 (!) 23 19 (!) 21  Temp:   98 F (36.7 C)   TempSrc:   Axillary   SpO2: 99%  95% 96%  Weight:      Height:        Physical Exam: AOx3, PERRL, EOMI, FS, TM, Strength 5/5 x4, SILTx4 Incisions c/d/I  Assessment & Plan: 80 y.o. man s/p burr hole evacuation of b/l SDH, recovering well, drains d/c'd 8/8, PT rec'd outpt PT  -discharge home today  Jadene Pierini  12/18/20 11:06 AM

## 2020-12-18 NOTE — Discharge Summary (Signed)
Discharge Summary  Date of Admission: 12/14/2020  Date of Discharge: 12/18/20  Attending Physician: Autumn Patty, MD  Hospital Course: Patient presented with progressive obtundation, CTH showed bilateral mixed density subdural hematomas. He was taken to the OR for bilateral burr holes and drainge. He was recovered in PACU and transferred to 4N ICU. Post-op he was dramatically improved, his drains were removed on POD2, his hospital course was uncomplicated and the patient was discharged home on 12/18/20. He will follow up in clinic with me in 2 weeks.  Neurologic exam at discharge:  AOx3, PERRL, EOMI, FS, TM Strength 5/5 x4, SILTx4, no drift Incisions c/d/i  Discharge diagnosis: Bilateral subdural hematomas  Jadene Pierini, MD 12/18/20 11:15 AM

## 2022-12-15 IMAGING — CT CT HEAD W/O CM
3 series · 15 of 47 positions shown, 18 images · non-contrast
Comparison: None.

CLINICAL DATA: Headache, new or worsening (Age >= 50y)

EXAM:
CT HEAD WITHOUT CONTRAST
TECHNIQUE: Contiguous axial images were obtained from the base of the skull
through the vertex without intravenous contrast.

[Series 2: head w o · axial · 0.46mm/px · z∈[+310,+440]mm · 9 of 32 slices shown, 12 images]
[im 3/32  brain]
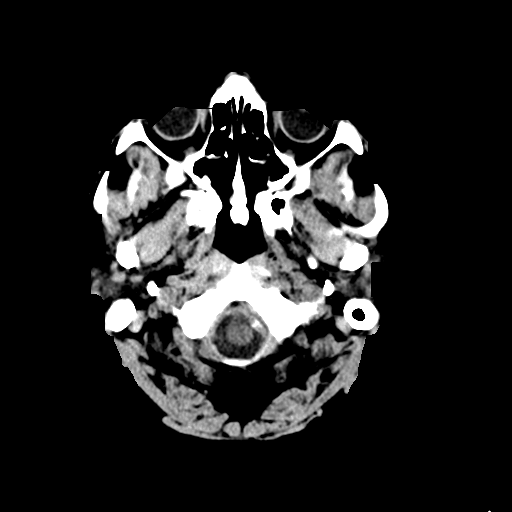
[im 3/32  bone]
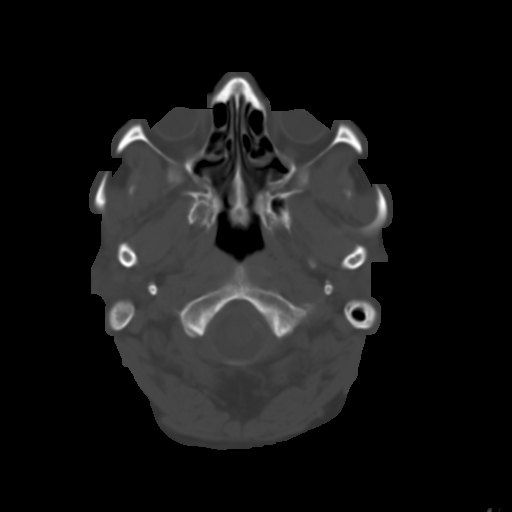
[im 6/32  brain]
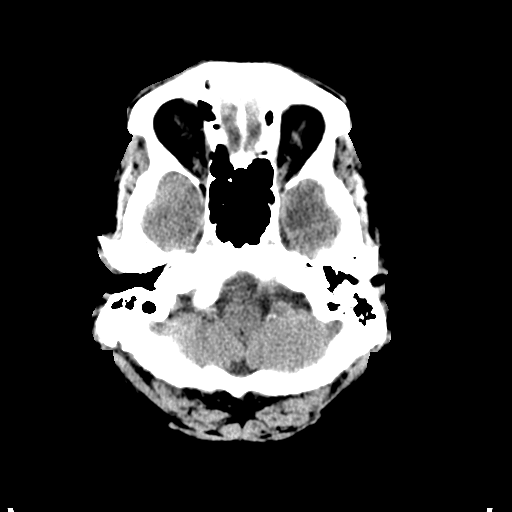
[im 9/32  brain]
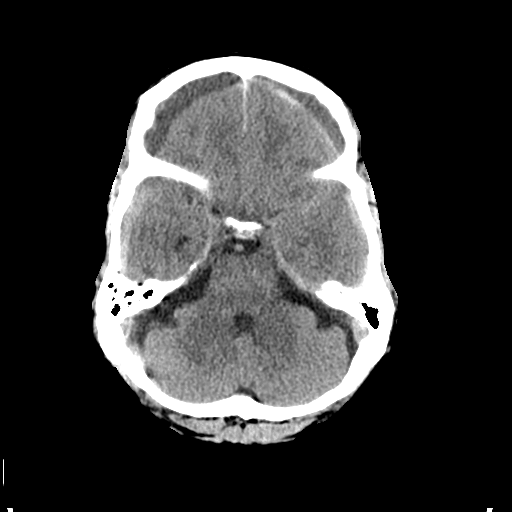
[im 12/32  brain]
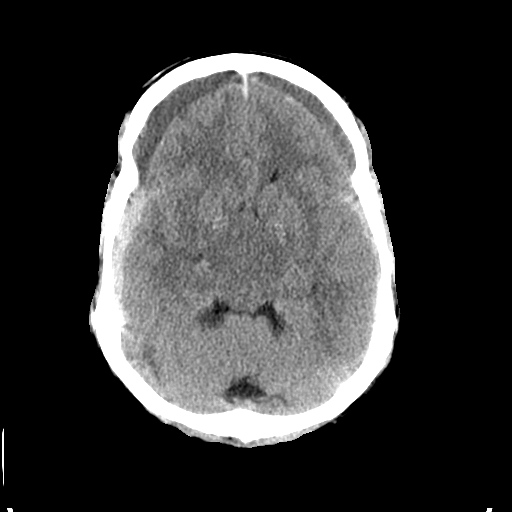
[im 17/32  brain]
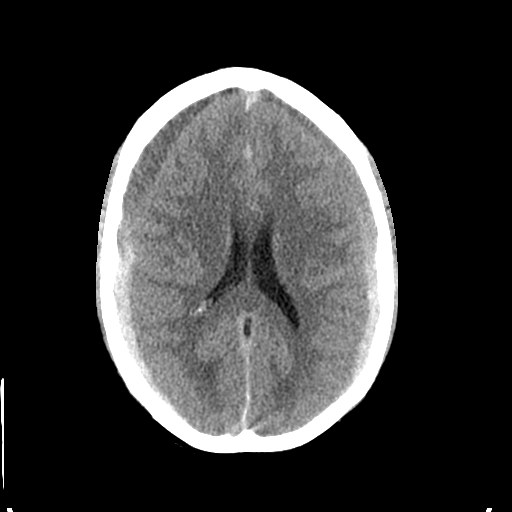
[im 17/32  bone]
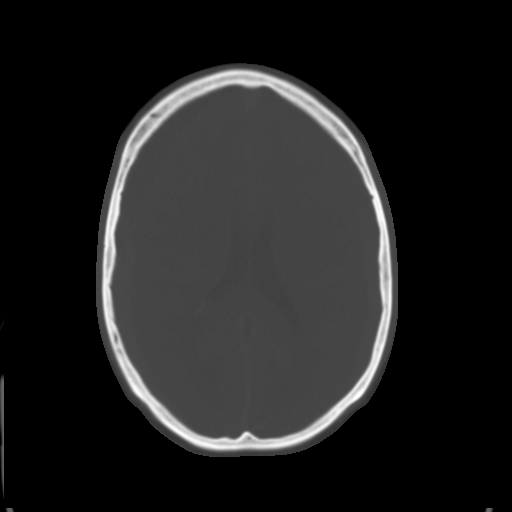
[im 20/32  brain]
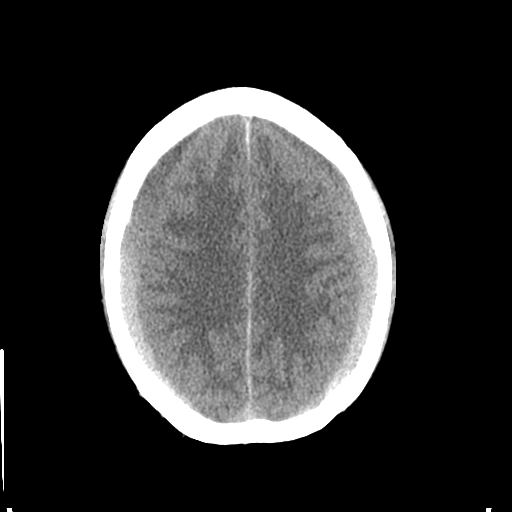
[im 23/32  brain]
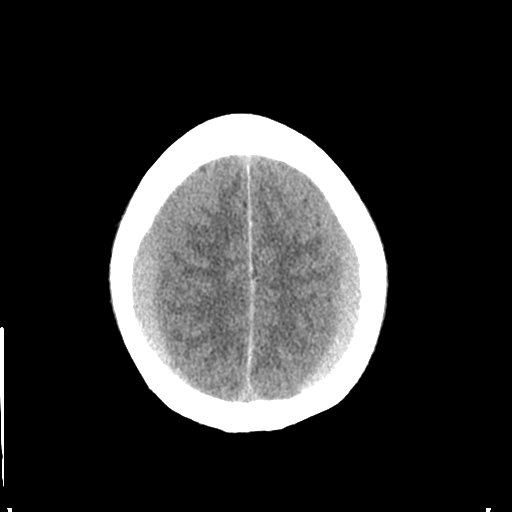
[im 26/32  brain]
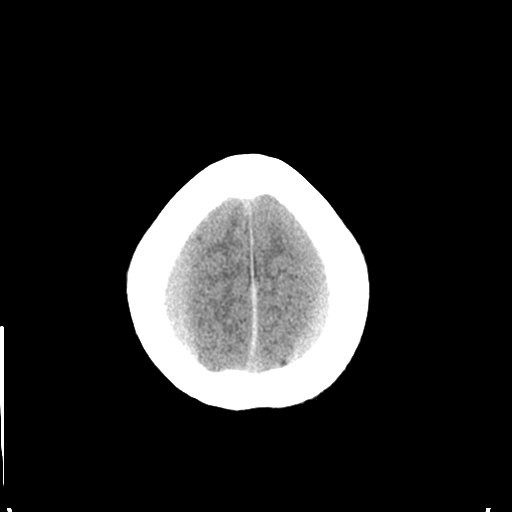
[im 29/32  brain]
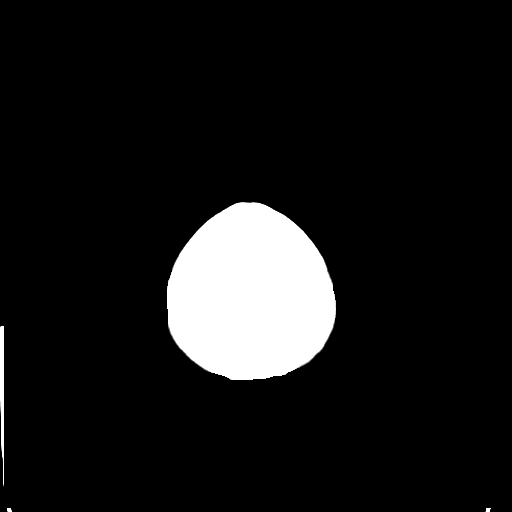
[im 29/32  bone]
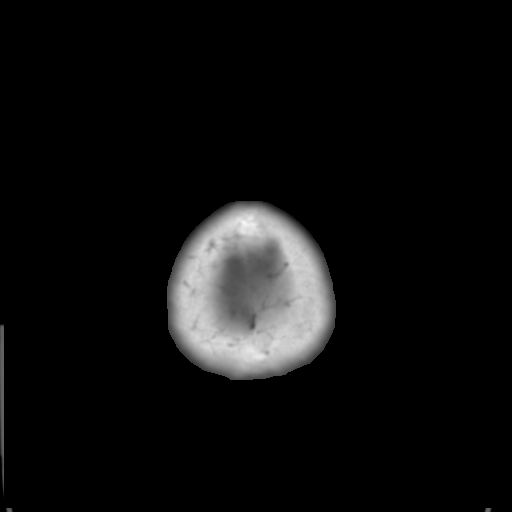

[Series 4: coronal soft · coronal · 0.35mm/px · 3 of 68 slices shown]
[im 23/68  brain]
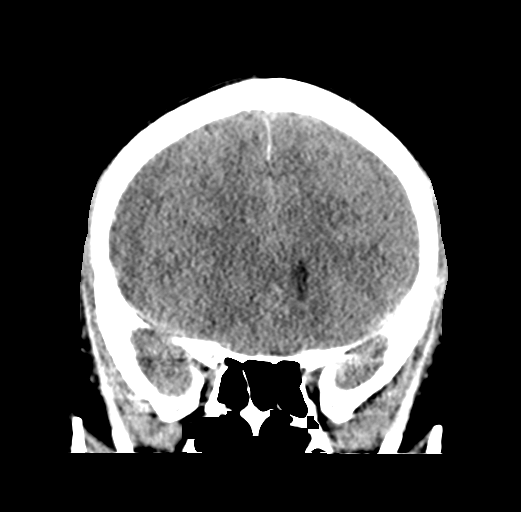
[im 30/68  brain]
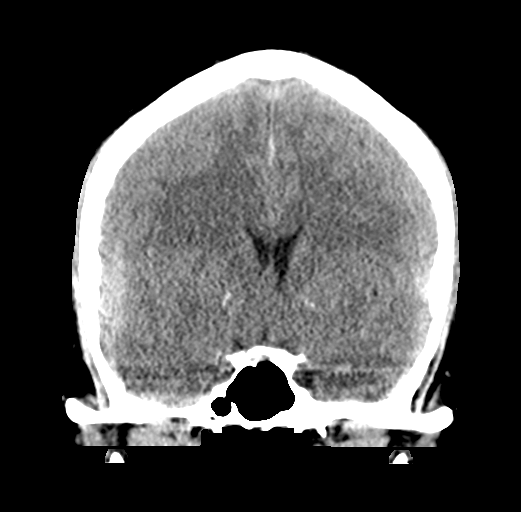
[im 38/68  brain]
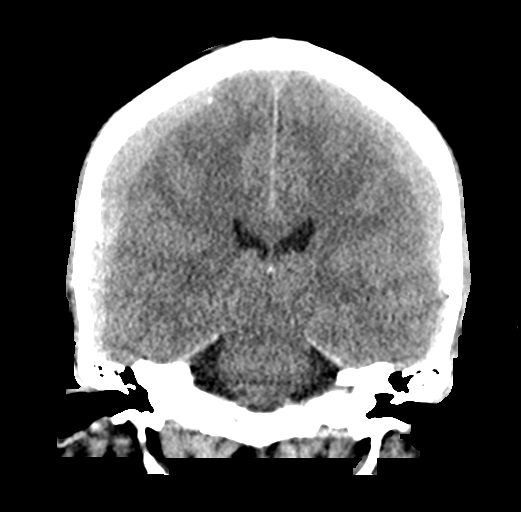

[Series 5: sagittal soft · sagittal · 0.34mm/px · 3 of 59 slices shown]
[im 20/59  brain]
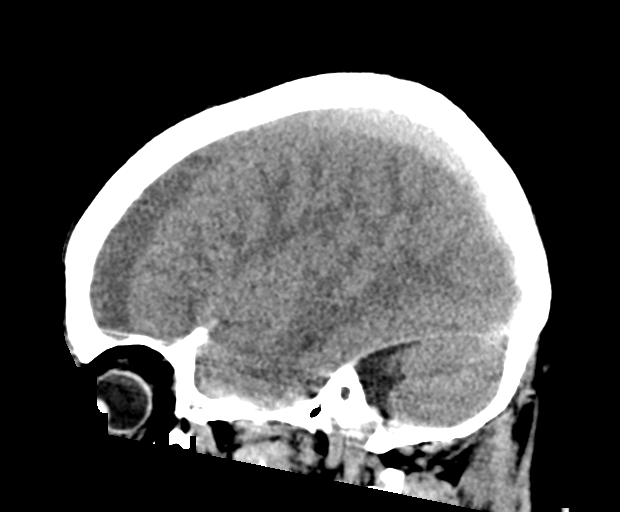
[im 30/59  brain]
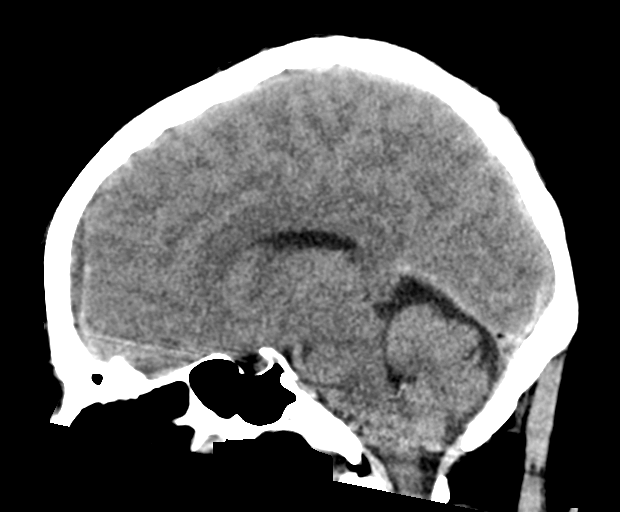
[im 39/59  brain]
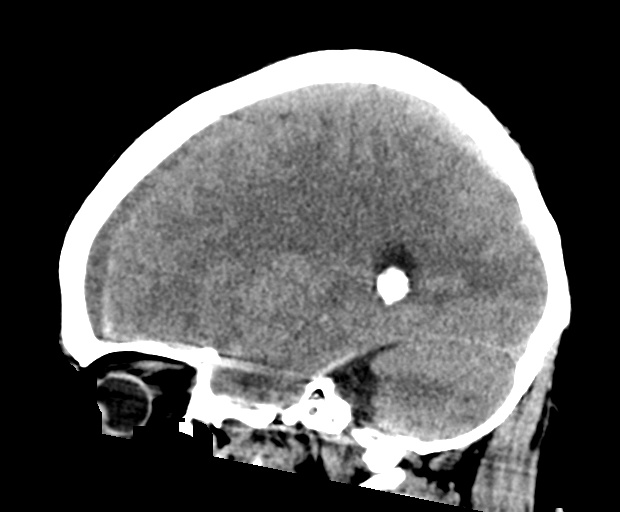

[15 of 47 positions shown; findings below may reference images not displayed]

FINDINGS: Brain: Bilateral mixed density subdural hematomas, measuring up to
1.2 cm in thickness on the right and 1.0 cm on the left with
internal hyperdensity compatible with acute/recent hemorrhage. Small
volume of acute hemorrhage also extending along the falx. Resulting
mass effect on bilateral cerebral convexities with diffuse sulcal
effacement, ventricular effacement, 2 mm of leftward midline shift
at the foramen of Neringucia, and an element of early downward herniation
with effacement of the suprasellar cistern and interpeduncular
cistern. No cerebellar tonsillar herniation. No evidence of acute
large vascular territory infarct. No hydrocephalus. No mass lesion.

Vascular: No hyperdense vessel identified. Hypodensity in the
anterior aspect of the straight sinus, most likely an arachnoid
granulation.

Skull: No acute fracture.

Sinuses/Orbits: Visualized sinuses are clear. No acute orbital
findings.

Other: No mastoid effusions.
IMPRESSION: 1. Bilateral mixed density subdural hematomas, measuring up to
cm in thickness on the right and 1.0 cm on the left with internal
hyperdensity compatible with acute/recent hemorrhage. Small volume
of acute hemorrhage also extending along the falx.
2. Resulting mass effect on bilateral cerebral convexities with
diffuse sulcal effacement, ventricular effacement, 2 mm of leftward
midline shift at the foramen of Neringucia, and an element of early
downward herniation with effacement of the suprasellar cistern and
interpeduncular cistern. Recommend neurosurgical consultation.

Findings and recommendations discussed with Nassir Macias, PA via
telephone at [DATE].
